# Patient Record
Sex: Female | Born: 1980 | Race: White | Hispanic: No | Marital: Married | Smoking: Never smoker
Health system: Southern US, Community
[De-identification: ages and names within clinical notes are randomized; demographics above are authoritative.]

## PROBLEM LIST (undated history)

## (undated) DIAGNOSIS — E282 Polycystic ovarian syndrome: Secondary | ICD-10-CM

## (undated) DIAGNOSIS — N809 Endometriosis, unspecified: Secondary | ICD-10-CM

## (undated) HISTORY — PX: LAPAROSCOPY: SHX197

## (undated) HISTORY — PX: HYSTEROSCOPY: SHX211

## (undated) HISTORY — PX: DILATION AND CURETTAGE OF UTERUS: SHX78

---

## 2014-10-28 ENCOUNTER — Other Ambulatory Visit (HOSPITAL_COMMUNITY): Payer: Self-pay | Admitting: *Deleted

## 2014-10-28 DIAGNOSIS — Z8279 Family history of other congenital malformations, deformations and chromosomal abnormalities: Secondary | ICD-10-CM

## 2014-10-29 ENCOUNTER — Encounter (HOSPITAL_COMMUNITY): Payer: Self-pay | Admitting: *Deleted

## 2014-11-01 ENCOUNTER — Encounter (HOSPITAL_COMMUNITY): Payer: Self-pay | Admitting: *Deleted

## 2014-11-10 ENCOUNTER — Ambulatory Visit (HOSPITAL_COMMUNITY)
Admission: RE | Admit: 2014-11-10 | Discharge: 2014-11-10 | Disposition: A | Discharge status: Home / Self Care | Payer: 59 | Source: Ambulatory Visit | Attending: *Deleted | Admitting: *Deleted

## 2014-11-10 ENCOUNTER — Encounter (HOSPITAL_COMMUNITY): Payer: Self-pay

## 2014-11-10 DIAGNOSIS — O352XX1 Maternal care for (suspected) hereditary disease in fetus, fetus 1: Secondary | ICD-10-CM

## 2014-11-10 DIAGNOSIS — Z3A20 20 weeks gestation of pregnancy: Secondary | ICD-10-CM | POA: Insufficient documentation

## 2014-11-10 DIAGNOSIS — Z8489 Family history of other specified conditions: Secondary | ICD-10-CM | POA: Diagnosis not present

## 2014-11-10 DIAGNOSIS — Z315 Encounter for genetic counseling: Secondary | ICD-10-CM | POA: Insufficient documentation

## 2014-11-10 DIAGNOSIS — Z8279 Family history of other congenital malformations, deformations and chromosomal abnormalities: Secondary | ICD-10-CM

## 2014-11-10 HISTORY — DX: Endometriosis, unspecified: N80.9

## 2014-11-10 HISTORY — DX: Polycystic ovarian syndrome: E28.2

## 2014-11-10 NOTE — Progress Notes (Signed)
Genetic Counseling  High-Risk Gestation Note  Appointment Date:  11/10/2014 Referred By: Laura Gip, MD Date of Birth:  January 02, 1981 Partner:  Laura Moore   Pregnancy History: [id number] Estimated Date of Delivery: 03/25/15 Estimated Gestational Age: 51w5dAttending: WBenjaman Lobe MD   I met with Mrs. Laura PIZZOLATOand her husband, [name] for genetic counseling because of family history concerns.  In summary:  Reviewed Mr. 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 Mr. 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 He was initially treated with casting, and later had surgical correction. The left leg is shorter than the right leg and he wears a lift in his left shoe. He reported that his right femur is significantly longer than his left femur and that his right foot is 2 shoe sizes larger than the left. This limb length discrepancy was apparent today, with his knees having significant positional variation while sitting. Mr. PCappsreported that he has no other body system asymmetry (i.e. nipple, facial, or upper extremity asymmetry). He has normal intellect. Laura Moore a history of health and medical concerns, not related to his lower extremities. Mr. PKreherreported that he was never evaluated by a medical geneticist. He does not know where he received his medical care as a child. I  offered to request his medical records to determine whether or not a specific cause for his lower extremity differences was ever discovered. He agreed to contact me if he learns more or determines where he received his care. The family histories were otherwise found to be noncontributory for birth defects, intellectual disability, and known genetic conditions. Without further information regarding the provided family history, an accurate genetic risk cannot be calculated. Further genetic counseling is warranted if more information is obtained.  We discussed that Mr. PArreyfeatures could be due to a diagnosis of isolated hemihyperplasia (IH). We discussed that (IH) is characterized by abnormal proliferation of cells, which results in asymmetric growth due to the overgrowth of one or more body parts. Hemihyperplasia may be an isolated finding, or it may be associated with a variety of multiple malformation syndromes. We discussed that his tibial curvature may be related or unrelated. Given that Mr. 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  They were counseled that [facility] affect an entire side of the body (including the brain, tongue, face etc.), a single limb, multiple limbs, the chest, or only the face. The degree of asymmetry is variable, and mild cases are easily overlooked. We discussed that I[place]is associated with an increased risk for embryonal tumors and that screening for such tumors is recommended earlier in life.   Isolated hemihyperplasia typically occurs sporadically, and is thought to be due to somatic mosaicism for gene mutation(s) that cause overgrowth. They were counseled  that there are reports of IH being dominant in inheritance. We reviewed dominant inheritance and the associated 50% risk for recurrence in this scenario. This considered, the risk  of recurrence for the current and future pregnancies is expected to range from <1% to as great as 50%. We discussed the availability of a referral to medical genetics for consultation. Laura Moore will contact me if he learns more about his own diagnosis or if he would like to schedule a consultation with a medical geneticist.  He understands that if his lower extremities differences are not related to [facility], and are the result of an underlying genetic condition, the risk of recurrence depends on the inheritance of the condition.    We reviewed available screening options. They were counseled regarding the option of a detailed anatomy ultrasound. They understand that ultrasound likely cannot detect minor asymmetry of limbs.  They elected to have ultrasound today. The report will be documented separately. There were no anomalies or markers for aneuploidy detected. Follow-up ultrasound for growth and to monitor for limb asymmetry is scheduled for [redacted] weeks gestation.   Laura Moore was provided with written information regarding cystic fibrosis (CF) including the carrier frequency and incidence in the Caucasian population, the availability of carrier testing and prenatal diagnosis if indicated.  In addition, we discussed that CF is routinely screened for as part of the [place] newborn screening panel.  She declined testing today.   Laura Moore denied exposure to environmental toxins or chemical agents. She denied the use of alcohol, tobacco or street drugs. She denied significant viral illnesses during the course of her pregnancy. 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 Approximately 1 in 6 confirmed pregnancies results in miscarriage. A single underlying cause is more likely to be suspected when a couple has experienced 3 or more losses. It is less likely that there will be an identifiable single underlying cause when a couple has  experienced less than 3 losses. We discussed several possible causes including chromosome rearrangements, antibodies, and thrombophilia. We reviewed chromosomes and examples of chromosome conditions. In approximately 3-8% of couples with recurrent pregnancy loss, one partner carries a chromosome variant, such as a balanced translocation. Being a carrier of a chromosome variant can increase the risk for abnormalities in the sperm or egg cell, which can increase the risk for miscarriage or the birth of a child with birth defects and/or mental retardation. We reviewed that inherited predisposition to clotting can also increase the risk for miscarriage given the association with increased risk for disrupted blood flow in the pregnancy. Additionally, the presence of certain antibodies have been associated with an increased risk for miscarriage. This couple reported that they have had peripheral blood chromosome analysis and both had normal results. In addition, Mrs. Flamm reported that she has had testing for thrombophilias and all of her test results were within normal limits. Medical records were not available to verify the reported history.     I counseled this couple regarding the above risks and available options.  The approximate face-to-face time with the genetic counselor was 43 minutes.  Sharyne Richters, MS Certified Genetic Counselor

## 2014-11-11 ENCOUNTER — Other Ambulatory Visit (HOSPITAL_COMMUNITY): Payer: Self-pay | Admitting: *Deleted

## 2014-11-11 DIAGNOSIS — O352XX0 Maternal care for (suspected) hereditary disease in fetus, not applicable or unspecified: Secondary | ICD-10-CM | POA: Insufficient documentation

## 2014-12-06 ENCOUNTER — Other Ambulatory Visit (HOSPITAL_COMMUNITY): Payer: Self-pay | Admitting: *Deleted

## 2014-12-06 DIAGNOSIS — Z8489 Family history of other specified conditions: Secondary | ICD-10-CM

## 2014-12-06 DIAGNOSIS — Z3A30 30 weeks gestation of pregnancy: Secondary | ICD-10-CM

## 2014-12-06 DIAGNOSIS — O09813 Supervision of pregnancy resulting from assisted reproductive technology, third trimester: Secondary | ICD-10-CM

## 2015-01-19 ENCOUNTER — Other Ambulatory Visit (HOSPITAL_COMMUNITY): Payer: Self-pay | Admitting: *Deleted

## 2015-01-19 ENCOUNTER — Ambulatory Visit (HOSPITAL_COMMUNITY)
Admission: RE | Admit: 2015-01-19 | Discharge: 2015-01-19 | Disposition: A | Discharge status: Home / Self Care | Payer: 59 | Source: Ambulatory Visit | Attending: *Deleted | Admitting: *Deleted

## 2015-01-19 DIAGNOSIS — O98519 Other viral diseases complicating pregnancy, unspecified trimester: Secondary | ICD-10-CM

## 2015-01-19 DIAGNOSIS — Z3A30 30 weeks gestation of pregnancy: Secondary | ICD-10-CM

## 2015-01-19 DIAGNOSIS — O09813 Supervision of pregnancy resulting from assisted reproductive technology, third trimester: Secondary | ICD-10-CM

## 2015-01-19 DIAGNOSIS — B009 Herpesviral infection, unspecified: Secondary | ICD-10-CM | POA: Diagnosis not present

## 2015-01-19 DIAGNOSIS — O352XX0 Maternal care for (suspected) hereditary disease in fetus, not applicable or unspecified: Secondary | ICD-10-CM

## 2015-01-19 DIAGNOSIS — O24414 Gestational diabetes mellitus in pregnancy, insulin controlled: Secondary | ICD-10-CM | POA: Diagnosis not present

## 2015-01-19 DIAGNOSIS — Z8489 Family history of other specified conditions: Secondary | ICD-10-CM

## 2015-01-27 ENCOUNTER — Ambulatory Visit (HOSPITAL_COMMUNITY)
Admission: RE | Admit: 2015-01-27 | Discharge: 2015-01-27 | Disposition: A | Discharge status: Home / Self Care | Payer: 59 | Source: Ambulatory Visit | Attending: *Deleted | Admitting: *Deleted

## 2015-01-27 ENCOUNTER — Encounter: Payer: 59 | Attending: Family Medicine | Admitting: *Deleted

## 2015-01-27 DIAGNOSIS — O24419 Gestational diabetes mellitus in pregnancy, unspecified control: Secondary | ICD-10-CM | POA: Diagnosis present

## 2015-01-27 DIAGNOSIS — Z713 Dietary counseling and surveillance: Secondary | ICD-10-CM | POA: Diagnosis not present

## 2015-01-27 NOTE — Progress Notes (Signed)
  Patient was seen on 01/27/15 for Gestational Diabetes self-management .  The following learning objectives were met by the patient :   States the definition of Gestational Diabetes  States why dietary management is important in controlling blood glucose  Describes the effects of carbohydrates on blood glucose levels  Demonstrates ability to create a balanced meal plan  Demonstrates carbohydrate counting   States when to check blood glucose levels  States the effect of stress and exercise on blood glucose levels  Plan:  Aim for 2 Carb Choices per meal (30 grams) +/- 1 either way for breakfast Aim for 3 Carb Choices per meal (45 grams) +/- 1 either way from lunch and dinner Aim for 1-2 Carbs per snack Begin reading food labels for Total Carbohydrate and sugar grams of foods Consider  increasing your activity level by walking daily as tolerated Begin checking BG before breakfast and 2 hours after first bit of breakfast, lunch and dinner after  as directed by MD  Take medication  as directed by MD  Patient instructed to monitor glucose levels: FBS: 60 - <90 2 hour: <120  Patient received the following handouts:  Nutrition Diabetes and Pregnancy  Carbohydrate Counting List  Meal Planning worksheet  In review of patient glucose readings of the last 6 days; FBS: 99-107   2hpp 114-138. Recommendation: Follow dietary guidelines                                Consider glyburide 2.16m with dinner if FBS remains elevated.

## 2015-01-28 ENCOUNTER — Other Ambulatory Visit (HOSPITAL_COMMUNITY): Payer: Self-pay | Admitting: *Deleted

## 2015-09-15 ENCOUNTER — Encounter (HOSPITAL_COMMUNITY): Payer: Self-pay | Admitting: *Deleted

## 2016-02-25 IMAGING — US US MFM OB FOLLOW-UP
1 series · 14 of 28 positions shown · non-contrast
Comparison: none

[Series 1: us mfm ob follow-up · 44 acquisitions, 14 frames shown]
[im 2/44]
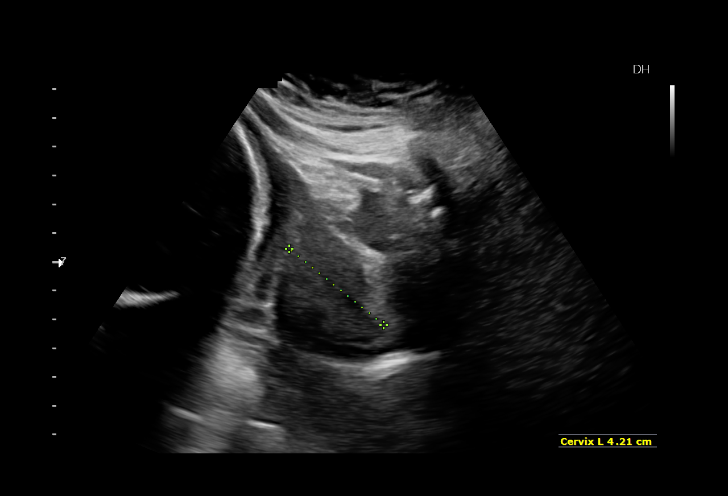
[im 5/44]
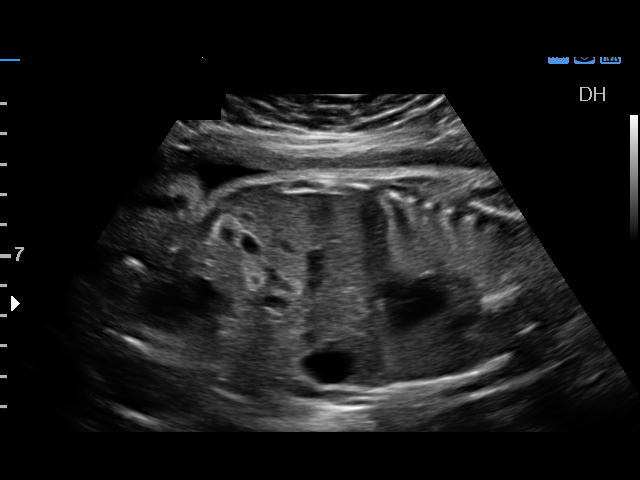
[im 8/44]
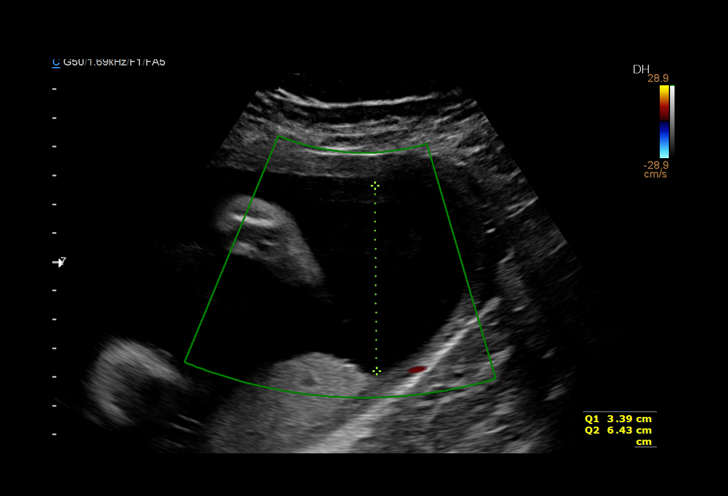
[im 12/44]
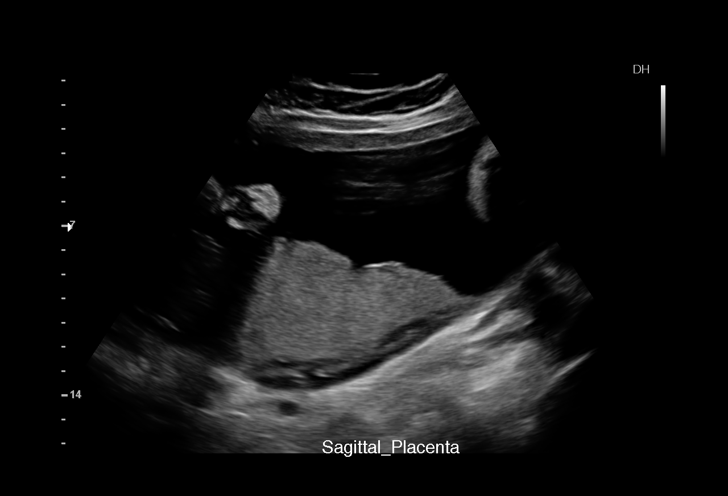
[im 15/44]
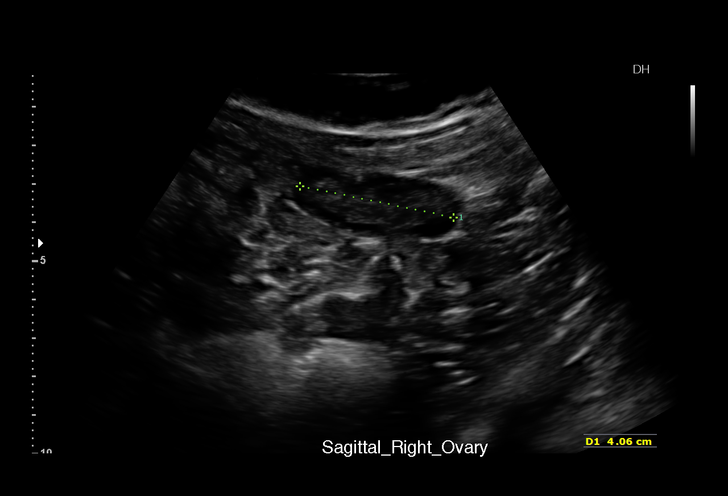
[im 18/44]
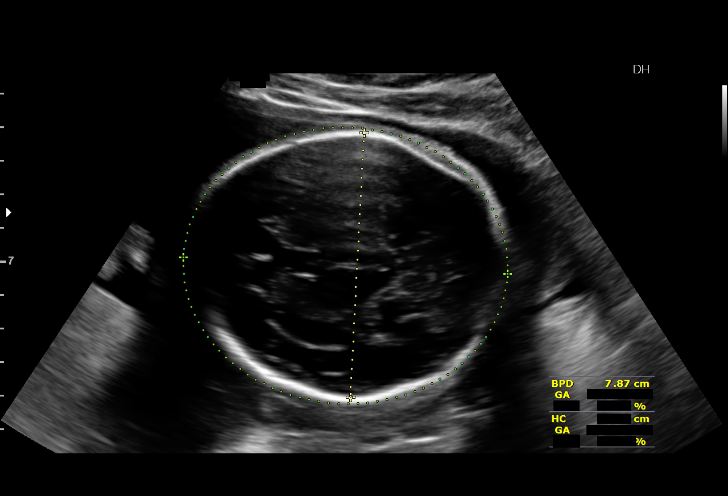
[im 21/44]
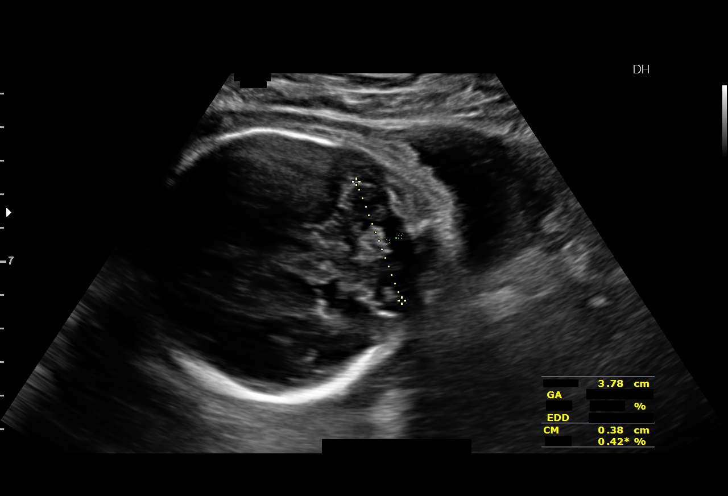
[im 24/44]
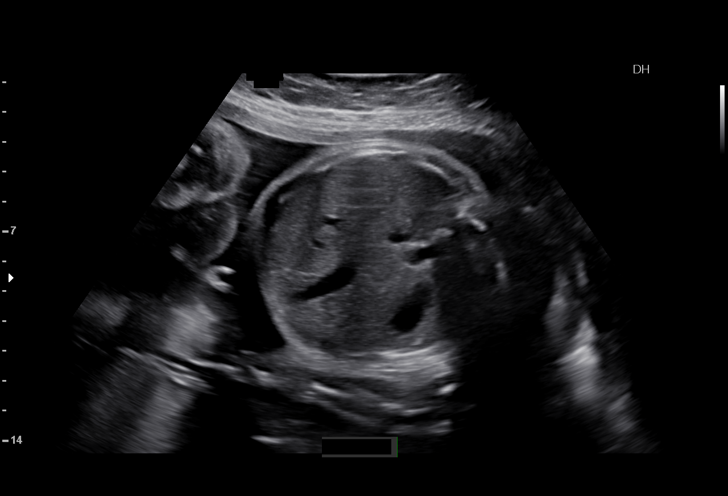
[im 28/44]
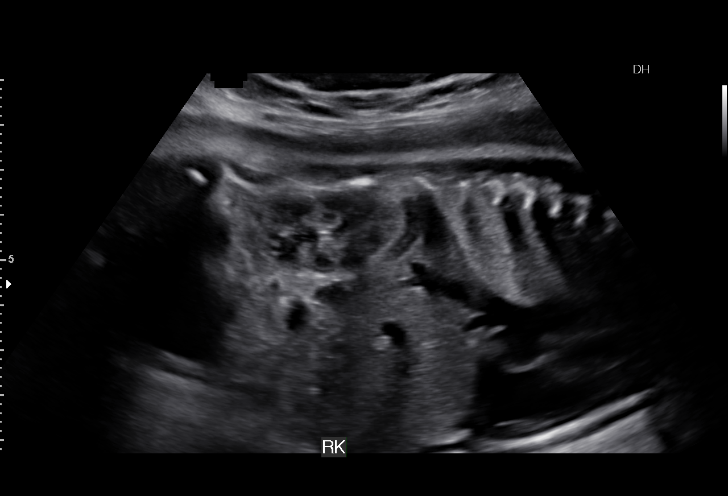
[im 31/44]
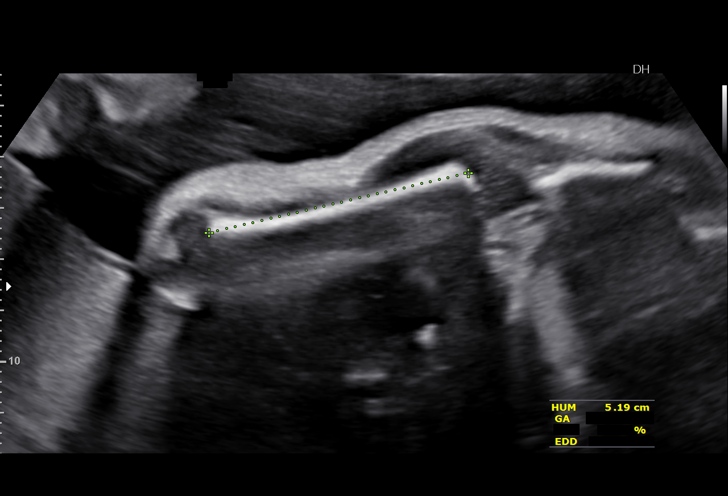
[im 34/44]
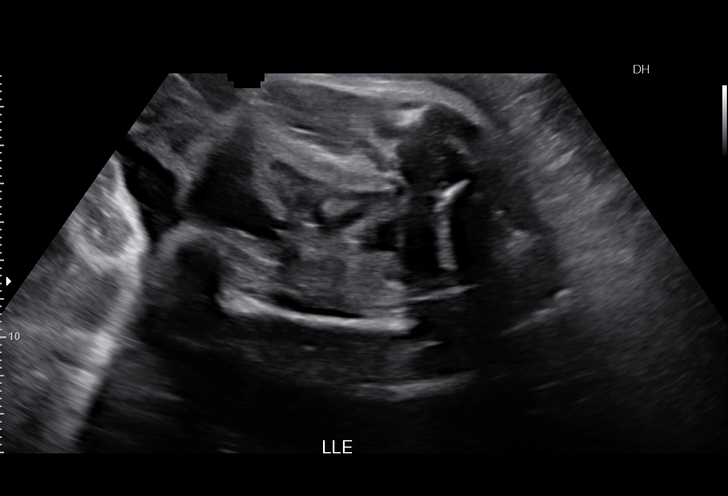
[im 37/44]
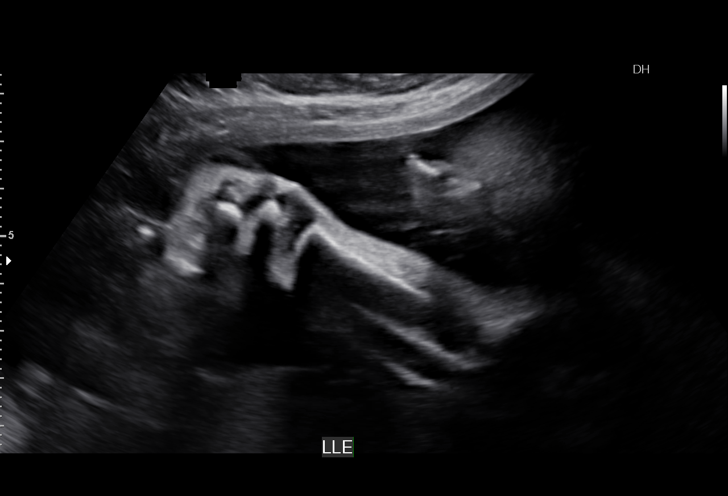
[im 40/44]
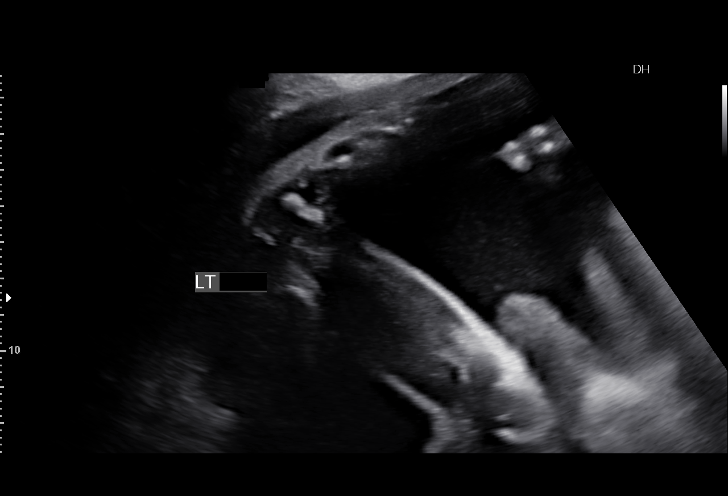
[im 44/44]
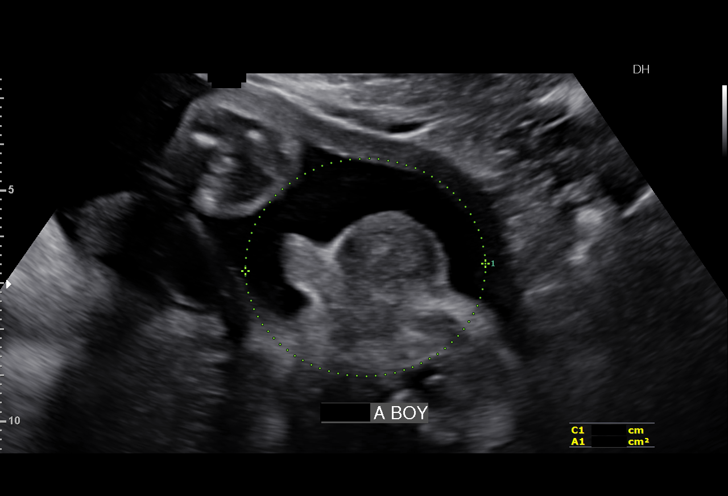

[14 of 28 positions shown; findings below may reference images not displayed]

OBSTETRICS REPORT
(Signed Final 01/19/2015 [DATE])

Date:

Service(s) Provided

Indications

30 weeks gestation of pregnancy
Gestational diabetes in pregnancy, insulin
controlled (metformin)
History of genetic / anatomic abnormality - FOB
left lower extremity defect (curvature)
Pregnancy resulting from assisted reproductive
technology (IVF)
Herpes simplex virus (DAYANA)
Fetal Evaluation

Num Of             1
Fetuses:
Fetal Heart        129                          bpm
Rate:
Cardiac Activity:  Observed
Presentation:      Cephalic
Placenta:          Posterior, above cervical
os
P. Cord            Previously Visualized
Insertion:

Amniotic Fluid
AFI FV:      Subjectively within normal limits
AFI Sum:     20.39    cm      79  %Tile     Larg Pckt:    6.45   cm
RUQ:   3.39    cm    RLQ:   6.43    cm   LUQ:    6.45    cm   LLQ:    4.12   cm
Biometry

BPD:     78.6   m    G. Age:   31w 4d                 CI:        77.66   70 - 86
m
FL/HC:      19.7   19.3 -
21.3
HC:     282.3   m    G. Age:   31w 0d        22  %    HC/AC:      1.10   0.96 -
m
AC:     257.6   m    G. Age:   30w 0d        24  %    FL/BPD      70.6   71 - 87
m                                     :
FL:      55.5   m    G. Age:   29w 2d         7  %    FL/AC:      21.5   20 - 24
m
HUM:     50.5   m    G. Age:   29w 4d        28  %
m
CER:     37.8   m    G. Age:   32w 3d        74  %
m

Est.        6557   gm    3 lb 4 oz      38   %
FW:
Gestational Age

U/S Today:     30w 3d                                         EDD:   03/27/15
Best:          30w 5d    Det. By:   Embryo Transfer           EDD:   03/25/15
Anatomy

Cranium:          Appears normal         Aortic Arch:       Previously seen
Fetal Cavum:      Appears normal         Ductal Arch:       Previously seen
Ventricles:       Appears normal         Diaphragm:         Previously seen
Choroid Plexus:   Previously seen        Stomach:           Appears normal,
left sided
Cerebellum:       Appears normal         Abdomen:           Appears normal
Posterior         Appears normal         Abdominal          Previously seen
Fossa:                                   Wall:
Nuchal Fold:      Not applicable (>20    Cord Vessels:      Previously seen
wks GA)
Face:             Orbits and profile     Kidneys:           Appear normal
previously seen
Lips:             Previously seen        Bladder:           Appears normal
Heart:            Appears normal         Spine:             Previously seen
(4CH, axis, and
situs)
RVOT:             Previously seen        Lower              Appears normal
Extremities:
LVOT:             Previously seen        Upper              Previously seen
Extremities:

Other:   Fetus appears to be a male. Heels appear normal. Rt 5th digit
previously visualized.
Cervix Uterus Adnexa

Cervical Length:    4.2       cm

Cervix:       Normal appearance by transabdominal scan.

Left Ovary:    Within normal limits.
Right Ovary:   Within normal limits.

Adnexa:     No abnormality visualized.
Impression

Singleton intrauterine pregnancy at 30 weeks 5 days
gestation with fetal cardiac activity
Cephalic presentation
Normal appearing fetal growth and amniotic fluid volume
Has not seen a dietician yet for her gestational diabetes
Recommendations

Serial monthly sonograms for fetal growth
Serial antenatal testing starting 
33-34 weeks gestation
Consider delivery at 39-40 weeks gestation if undelivered
